# Patient Record
Sex: Female | Born: 2000 | Race: Black or African American | Hispanic: No | Marital: Single | Smoking: Never smoker
Health system: Southern US, Community
[De-identification: ages and names within clinical notes are randomized; demographics above are authoritative.]

## PROBLEM LIST (undated history)

## (undated) ENCOUNTER — Emergency Department (HOSPITAL_COMMUNITY): Admission: EM | Discharge status: Absent without leave | Payer: Self-pay

## (undated) DIAGNOSIS — L309 Dermatitis, unspecified: Secondary | ICD-10-CM

## (undated) HISTORY — PX: TONSILLECTOMY: SUR1361

---
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
## 2019-06-04 DIAGNOSIS — H40033 Anatomical narrow angle, bilateral: Secondary | ICD-10-CM | POA: Diagnosis not present

## 2019-06-04 DIAGNOSIS — H16223 Keratoconjunctivitis sicca, not specified as Sjogren's, bilateral: Secondary | ICD-10-CM | POA: Diagnosis not present

## 2019-06-06 ENCOUNTER — Ambulatory Visit: Payer: Medicaid Other | Attending: Family

## 2019-06-06 DIAGNOSIS — Z23 Encounter for immunization: Secondary | ICD-10-CM

## 2019-06-06 NOTE — Progress Notes (Signed)
   Covid-19 Vaccination Clinic  Name:  Wyvonne Carda    MRN: [id number] DOB: Jun 07, 2000  06/06/2019  Ms. James-Flack was observed post Covid-19 immunization for 15 minutes without incident. She was provided with Vaccine Information Sheet and instruction to access the V-Safe system.   Ms. Yiu was instructed to call 911 with any severe reactions post vaccine: Marland Kitchen Difficulty breathing  . Swelling of face and throat  . A fast heartbeat  . A bad rash all over body  . Dizziness and weakness   Immunizations Administered    Name Date Dose VIS Date Route   Moderna COVID-19 Vaccine 06/06/2019  3:06 PM 0.5 mL 01/22/2019 Intramuscular   Manufacturer: Moderna   Lot: [id number]   NDC: 78478-412-82

## 2019-07-05 DIAGNOSIS — H1013 Acute atopic conjunctivitis, bilateral: Secondary | ICD-10-CM | POA: Diagnosis not present

## 2019-07-09 ENCOUNTER — Ambulatory Visit: Payer: Medicaid Other | Attending: Family

## 2019-07-09 DIAGNOSIS — Z23 Encounter for immunization: Secondary | ICD-10-CM

## 2019-07-09 NOTE — Progress Notes (Signed)
   Covid-19 Vaccination Clinic  Name:  [name]    MRN: [id number] DOB: [birth year]  07/09/2019  Caroline Macias was observed post Covid-19 immunization for 15 minutes without incident. She was provided with Vaccine Information Sheet and instruction to access the V-Safe system.   [name] was instructed to call 911 with any severe reactions post vaccine: Marland Kitchen Difficulty breathing  . Swelling of face and throat  . A fast heartbeat  . A bad rash all over body  . Dizziness and weakness   Immunizations Administered    Name Date Dose VIS Date Route   Moderna COVID-19 Vaccine 07/09/2019  2:03 PM 0.5 mL 01/2019 Intramuscular   Manufacturer: Moderna   Lot: [id number]   NDC: 50413-643-83

## 2019-08-18 ENCOUNTER — Encounter (HOSPITAL_COMMUNITY): Payer: Self-pay | Admitting: *Deleted

## 2019-08-18 ENCOUNTER — Other Ambulatory Visit: Payer: Self-pay

## 2019-08-18 ENCOUNTER — Emergency Department (HOSPITAL_COMMUNITY)
Admission: EM | Admit: 2019-08-18 | Discharge: 2019-08-18 | Disposition: A | Discharge status: Home / Self Care | Payer: Medicaid Other | Attending: Emergency Medicine | Admitting: Emergency Medicine

## 2019-08-18 ENCOUNTER — Emergency Department (HOSPITAL_COMMUNITY): Payer: Medicaid Other

## 2019-08-18 DIAGNOSIS — Z23 Encounter for immunization: Secondary | ICD-10-CM | POA: Diagnosis not present

## 2019-08-18 DIAGNOSIS — S92354A Nondisplaced fracture of fifth metatarsal bone, right foot, initial encounter for closed fracture: Secondary | ICD-10-CM | POA: Insufficient documentation

## 2019-08-18 DIAGNOSIS — Y998 Other external cause status: Secondary | ICD-10-CM | POA: Insufficient documentation

## 2019-08-18 DIAGNOSIS — W3400XA Accidental discharge from unspecified firearms or gun, initial encounter: Secondary | ICD-10-CM | POA: Diagnosis not present

## 2019-08-18 DIAGNOSIS — Y9389 Activity, other specified: Secondary | ICD-10-CM | POA: Insufficient documentation

## 2019-08-18 DIAGNOSIS — S91301A Unspecified open wound, right foot, initial encounter: Secondary | ICD-10-CM | POA: Diagnosis not present

## 2019-08-18 DIAGNOSIS — S92354B Nondisplaced fracture of fifth metatarsal bone, right foot, initial encounter for open fracture: Secondary | ICD-10-CM | POA: Diagnosis not present

## 2019-08-18 DIAGNOSIS — Y92016 Swimming-pool in single-family (private) house or garden as the place of occurrence of the external cause: Secondary | ICD-10-CM | POA: Diagnosis not present

## 2019-08-18 DIAGNOSIS — S99921A Unspecified injury of right foot, initial encounter: Secondary | ICD-10-CM | POA: Diagnosis present

## 2019-08-18 DIAGNOSIS — S91341A Puncture wound with foreign body, right foot, initial encounter: Secondary | ICD-10-CM | POA: Diagnosis not present

## 2019-08-18 DIAGNOSIS — T07XXXA Unspecified multiple injuries, initial encounter: Secondary | ICD-10-CM | POA: Diagnosis not present

## 2019-08-18 DIAGNOSIS — M7989 Other specified soft tissue disorders: Secondary | ICD-10-CM | POA: Diagnosis not present

## 2019-08-18 DIAGNOSIS — R52 Pain, unspecified: Secondary | ICD-10-CM | POA: Diagnosis not present

## 2019-08-18 DIAGNOSIS — S91321A Laceration with foreign body, right foot, initial encounter: Secondary | ICD-10-CM | POA: Diagnosis not present

## 2019-08-18 MED ORDER — LIDOCAINE-EPINEPHRINE (PF) 2 %-1:200000 IJ SOLN
10.0000 mL | Freq: Once | INTRAMUSCULAR | Status: AC
  Administered 2019-08-18: 10 mL
  Filled 2019-08-18: qty 20

## 2019-08-18 MED ORDER — TETANUS-DIPHTH-ACELL PERTUSSIS 5-2.5-18.5 LF-MCG/0.5 IM SUSP
0.5000 mL | Freq: Once | INTRAMUSCULAR | Status: AC
  Administered 2019-08-18: 0.5 mL via INTRAMUSCULAR
  Filled 2019-08-18: qty 0.5

## 2019-08-18 MED ORDER — CEPHALEXIN 500 MG PO CAPS: 500.0000 mg | ORAL_CAPSULE | Freq: Four times a day (QID) | ORAL | 0 refills | Status: DC

## 2019-08-18 MED ORDER — FENTANYL CITRATE (PF) 100 MCG/2ML IJ SOLN
50.0000 ug | Freq: Once | INTRAMUSCULAR | Status: AC
  Administered 2019-08-18: 50 ug via INTRAVENOUS
  Filled 2019-08-18: qty 2

## 2019-08-18 MED ORDER — CEFAZOLIN SODIUM-DEXTROSE 1-4 GM/50ML-% IV SOLN
1.0000 g | Freq: Once | INTRAVENOUS | Status: AC
  Administered 2019-08-18: 1 g via INTRAVENOUS
  Filled 2019-08-18: qty 50
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
## 2019-08-18 NOTE — Consult Note (Signed)
Visited briefly with pt, offering spiritual support and prayer, which she appreciated. Pt is Caroline Macias.  Rev. Donnel Saxon Chaplain

## 2019-08-18 NOTE — Progress Notes (Signed)
Orthopedic Tech Progress Note Patient Details:  Caroline Macias 05-02-2000 [id number]  Ortho Devices Type of Ortho Device: CAM walker Ortho Device/Splint Location: rle Ortho Device/Splint Interventions: Ordered, Application, Adjustment   Post Interventions Patient Tolerated: Well Instructions Provided: Care of device, Adjustment of device   Trinna Post 08/18/2019, 10:06 PM

## 2019-08-18 NOTE — ED Notes (Signed)
Patient verbalizes understanding of discharge instructions. Opportunity for questioning and answers were provided. Armband removed by staff, pt discharged from ED. Pt. ambulatory and discharged home.  

## 2019-08-18 NOTE — Discharge Instructions (Addendum)
Call Dr. Luvenia Starch office this week for follow up. Call Dr. Jena Gauss or return for any signs or symptoms of infection such as redness, discharge, or increasing pain.
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
## 2019-09-07 DIAGNOSIS — H5213 Myopia, bilateral: Secondary | ICD-10-CM | POA: Diagnosis not present

## 2019-09-29 DIAGNOSIS — H5213 Myopia, bilateral: Secondary | ICD-10-CM | POA: Diagnosis not present

## 2019-12-10 ENCOUNTER — Encounter: Payer: Self-pay | Admitting: Nurse Practitioner

## 2019-12-10 ENCOUNTER — Other Ambulatory Visit: Payer: Self-pay

## 2019-12-10 ENCOUNTER — Ambulatory Visit: Payer: Medicaid Other | Attending: Nurse Practitioner | Admitting: Nurse Practitioner

## 2019-12-10 VITALS — Ht 64.0 in | Wt 180.0 lb

## 2019-12-10 DIAGNOSIS — L700 Acne vulgaris: Secondary | ICD-10-CM | POA: Diagnosis not present

## 2019-12-10 DIAGNOSIS — L209 Atopic dermatitis, unspecified: Secondary | ICD-10-CM

## 2019-12-10 MED ORDER — MOMETASONE FUROATE 0.1 % EX OINT: TOPICAL_OINTMENT | Freq: Every day | CUTANEOUS | 2 refills | Status: DC

## 2019-12-10 MED ORDER — ADAPALENE 0.3 % EX GEL: CUTANEOUS | 1 refills | Status: DC

## 2019-12-10 NOTE — Progress Notes (Signed)
Virtual Visit via Telephone Note Due to national recommendations of social distancing due to COVID 19, telehealth visit is felt to be most appropriate for this patient at this time.  I discussed the limitations, risks, security and privacy concerns of performing an evaluation and management service by telephone and the availability of in person appointments. I also discussed with the patient that there may be a patient responsible charge related to this service. The patient expressed understanding and agreed to proceed.    I connected with Caroline Macias on 12/10/19  at   8:50 AM EDT  EDT by telephone and verified that I am speaking with the correct person using two identifiers.   Consent I discussed the limitations, risks, security and privacy concerns of performing an evaluation and management service by telephone and the availability of in person appointments. I also discussed with the patient that there may be a patient responsible charge related to this service. The patient expressed understanding and agreed to proceed.   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  She is sexually active and currently not on any contraception at this time.  I discussed oral contraceptives and Depo Provera which we provide here at the community clinic however she is interested in the Nexplanon.  I will have her referred to Dr. Earlene Plater at the counseling office for further counseling.   Skin Problem Notes significant scarring on face due to acne. She has been out of differin gel. Will refill today. She does state that even when she was using differin gel consistently she was still  experiencing scarring of skin.  Also notes areas of hyperpigmentation between both thighs. Onset 2 months ago. Denies irritation or itching.   Past Medical History:  Diagnosis Date   Eczema     Past Surgical History:  Procedure Laterality Date   TONSILLECTOMY      Family History  Problem Relation Age of Onset   Diabetes Neg Hx     Social History   Socioeconomic History   Marital status: Single    Spouse name: Not on file   Number of children: Not on file   Years of education: Not on file   Highest education level: Not on file  Occupational History   Not on file  Tobacco Use   Smoking status: Never Smoker   Smokeless tobacco: Never Used  Substance and Sexual Activity   Alcohol use: Not Currently   Drug use: Not Currently   Sexual activity: Not Currently  Other Topics Concern   Not on file  Social History Narrative   Not on file   Social Determinants of Health   Financial Resource Strain:    Difficulty of Paying Living Expenses: Not on file  Food Insecurity:    Worried About Running Out of Food in the Last Year: Not on file   Ran Out of Food in the Last Year: Not on file  Transportation Needs:    Lack of Transportation (Medical): Not on file   Lack of Transportation (Non-Medical): Not on file  Physical Activity:    Days of Exercise per Week: Not on file   Minutes of Exercise per Session: Not on file  Stress:  Feeling of Stress : Not on file  Social Connections:    Frequency of Communication with Friends and Family: Not on file   Frequency of Social Gatherings with Friends and Family: Not on file   Attends Religious Services: Not on file   Active Member of Clubs or Organizations: Not on file   Attends Banker Meetings: Not on file   Marital Status: Not on file     Observations/Objective: Awake, alert and oriented x 3   Review of Systems  Constitutional: Negative for fever, malaise/fatigue and weight loss.   HENT: Negative.  Negative for nosebleeds.   Eyes: Negative.  Negative for blurred vision, double vision and photophobia.  Respiratory: Negative.  Negative for cough and shortness of breath.   Cardiovascular: Negative.  Negative for chest pain, palpitations and leg swelling.  Gastrointestinal: Negative.  Negative for heartburn, nausea and vomiting.  Musculoskeletal: Negative.  Negative for myalgias.  Skin:       SEE HPI  Neurological: Negative.  Negative for dizziness, focal weakness, seizures and headaches.  Psychiatric/Behavioral: Negative.  Negative for suicidal ideas.    Assessment and Plan: [name] was seen today for establish care.  Diagnoses and all orders for this visit:  Acne vulgaris -     Ambulatory referral to Dermatology -     Adapalene 0.3 % gel; Apply topically at bedtime  Atopic dermatitis, unspecified type -     mometasone (ELOCON) 0.1 % ointment; Apply topically daily.     Follow Up Instructions Return for FASTING labs and Physical.     I discussed the assessment and treatment plan with the patient. The patient was provided an opportunity to ask questions and all were answered. The patient agreed with the plan and demonstrated an understanding of the instructions.   The patient was advised to call back or seek an in-person evaluation if the symptoms worsen or if the condition fails to improve as anticipated.  I provided 18 minutes of non-face-to-face time during this encounter including median intraservice time, reviewing previous notes, labs, imaging, medications and explaining diagnosis and management.  Claiborne Rigg, FNP-BC

## 2019-12-12 ENCOUNTER — Telehealth: Payer: Medicaid Other | Admitting: Internal Medicine

## 2019-12-12 ENCOUNTER — Other Ambulatory Visit: Payer: Self-pay | Admitting: Nurse Practitioner

## 2019-12-12 ENCOUNTER — Other Ambulatory Visit: Payer: Medicaid Other

## 2019-12-12 DIAGNOSIS — Z3009 Encounter for other general counseling and advice on contraception: Secondary | ICD-10-CM

## 2019-12-12 MED ORDER — MOMETASONE FUROATE 0.1 % EX CREA: 1.0000 "application " | TOPICAL_CREAM | Freq: Every day | CUTANEOUS | 3 refills | Status: DC

## 2019-12-30 ENCOUNTER — Encounter: Payer: Medicaid Other | Admitting: Nurse Practitioner

## 2020-01-28 ENCOUNTER — Encounter: Payer: Self-pay | Admitting: Nurse Practitioner

## 2020-01-28 ENCOUNTER — Other Ambulatory Visit: Payer: Self-pay | Admitting: Nurse Practitioner

## 2020-01-28 ENCOUNTER — Other Ambulatory Visit: Payer: Self-pay

## 2020-01-28 ENCOUNTER — Ambulatory Visit: Payer: Medicaid Other | Attending: Nurse Practitioner | Admitting: Nurse Practitioner

## 2020-01-28 VITALS — BP 104/57 | HR 69 | Temp 95.7°F | Ht 65.0 in | Wt 162.0 lb

## 2020-01-28 DIAGNOSIS — L209 Atopic dermatitis, unspecified: Secondary | ICD-10-CM | POA: Diagnosis not present

## 2020-01-28 DIAGNOSIS — Z130 Encounter for screening for diseases of the blood and blood-forming organs and certain disorders involving the immune mechanism: Secondary | ICD-10-CM

## 2020-01-28 DIAGNOSIS — Z114 Encounter for screening for human immunodeficiency virus [HIV]: Secondary | ICD-10-CM | POA: Diagnosis not present

## 2020-01-28 DIAGNOSIS — Z1159 Encounter for screening for other viral diseases: Secondary | ICD-10-CM | POA: Diagnosis not present

## 2020-01-28 DIAGNOSIS — Z0000 Encounter for general adult medical examination without abnormal findings: Secondary | ICD-10-CM | POA: Diagnosis not present

## 2020-01-28 DIAGNOSIS — L700 Acne vulgaris: Secondary | ICD-10-CM | POA: Diagnosis not present

## 2020-01-28 MED ORDER — IBUPROFEN 600 MG PO TABS: 600.0000 mg | ORAL_TABLET | Freq: Three times a day (TID) | ORAL | 1 refills | Status: AC | PRN

## 2020-01-28 MED ORDER — MOMETASONE FUROATE 0.1 % EX CREA: 1.0000 "application " | TOPICAL_CREAM | Freq: Every day | CUTANEOUS | 3 refills | Status: AC

## 2020-01-28 MED ORDER — ADAPALENE 0.3 % EX GEL: CUTANEOUS | 1 refills | Status: DC

## 2020-01-28 NOTE — Progress Notes (Signed)
Assessment & Plan:  Caroline Macias was seen today for annual exam.  Diagnoses and all orders for this visit:  Encounter for annual physical exam  Need for hepatitis C screening test -     HCV Ab w Reflex to Quant PCR  Encounter for screening for HIV -     HIV antibody (with reflex)  Screening for deficiency anemia -     CBC  Acne vulgaris -     Ambulatory referral to Dermatology -     Adapalene 0.3 % gel; Apply topically at bedtime  Atopic dermatitis, unspecified type -     mometasone (ELOCON) 0.1 % cream; Apply 1 application topically daily.    Patient has been counseled on age-appropriate routine health concerns for screening and prevention. These are reviewed and up-to-date. Referrals have been placed accordingly. Immunizations are up-to-date or declined.    Subjective:   Chief Complaint  Patient presents with  . Annual Exam    Pt. is here for a physical.    HPI Taitum James-Flack 19 y.o. female presents to office today for annual physical.    She was referred to Dermatology a few months for acne vulgaris however she did not realize they had left a message to schedule. I have her their phone number to schedule and placed another referral. Her acne has been unresponsive to the adalapene gel. She also tells me she has been using it twice a day. I instructed her that it is only once a day.  Review of Systems  Constitutional: Negative for fever, malaise/fatigue and weight loss.  HENT: Negative.  Negative for nosebleeds.   Eyes: Negative.  Negative for blurred vision, double vision and photophobia.  Respiratory: Negative.  Negative for cough and shortness of breath.   Cardiovascular: Negative.  Negative for chest pain, palpitations and leg swelling.  Gastrointestinal: Negative.  Negative for heartburn, nausea and vomiting.  Genitourinary: Negative.   Musculoskeletal: Negative.  Negative for myalgias.  Skin: Negative.        Acne vulgaris Atopic dermatitis Vitiligo b/l  thighs  Neurological: Negative.  Negative for dizziness, focal weakness, seizures and headaches.  Endo/Heme/Allergies: Negative.   Psychiatric/Behavioral: Negative.  Negative for suicidal ideas.    Past Medical History:  Diagnosis Date  . Eczema     Past Surgical History:  Procedure Laterality Date  . TONSILLECTOMY      Family History  Problem Relation Age of Onset  . Diabetes Neg Hx     Social History Reviewed with no changes to be made today.   Outpatient Medications Prior to Visit  Medication Sig Dispense Refill  . Adapalene 0.3 % gel Apply topically at bedtime 90 g 1  . mometasone (ELOCON) 0.1 % cream Apply 1 application topically daily. 100 g 3   No facility-administered medications prior to visit.    No Known Allergies     Objective:    BP (!) 104/57 (BP Location: Left Arm, Patient Position: Sitting, Cuff Size: Normal)   Pulse 69   Temp (!) 95.7 F (35.4 C) (Temporal)   Ht 5\' 5"  (1.651 m)   Wt 162 lb (73.5 kg)   LMP 01/28/2020   SpO2 98%   BMI 26.96 kg/m  Wt Readings from Last 3 Encounters:  01/28/20 162 lb (73.5 kg)  12/10/19 180 lb (81.6 kg)  08/18/19 204 lb 5.9 oz (92.7 kg)    Physical Exam Constitutional:      Appearance: She is well-developed.  HENT:     Head: Normocephalic  and atraumatic.     Right Ear: Hearing, ear canal and external ear normal. No middle ear effusion. Tympanic membrane is scarred.     Left Ear: Hearing, tympanic membrane, ear canal and external ear normal.     Nose: Nose normal.     Mouth/Throat:     Pharynx: No oropharyngeal exudate.  Eyes:     General: No scleral icterus.       Right eye: No discharge.     Conjunctiva/sclera: Conjunctivae normal.     Pupils: Pupils are equal, round, and reactive to light.  Neck:     Thyroid: No thyroid mass or thyromegaly.     Trachea: No tracheal deviation.  Cardiovascular:     Rate and Rhythm: Normal rate and regular rhythm.     Heart sounds: Normal heart sounds. No murmur  heard.  No friction rub.  Pulmonary:     Effort: Pulmonary effort is normal. No accessory muscle usage or respiratory distress.     Breath sounds: Normal breath sounds. No decreased breath sounds, wheezing, rhonchi or rales.  Abdominal:     General: Bowel sounds are normal. There is no distension.     Palpations: Abdomen is soft. There is no mass.     Tenderness: There is no abdominal tenderness. There is no guarding or rebound.  Musculoskeletal:        General: No tenderness or deformity. Normal range of motion.     Cervical back: Normal range of motion and neck supple.  Lymphadenopathy:     Cervical: No cervical adenopathy.  Skin:    General: Skin is warm and dry.     Findings: No erythema.  Neurological:     Mental Status: She is alert and oriented to person, place, and time.     Cranial Nerves: Cranial nerves are intact. No cranial nerve deficit.     Sensory: Sensation is intact.     Motor: Motor function is intact.     Coordination: Coordination is intact. Coordination normal.     Gait: Gait is intact.     Deep Tendon Reflexes:     Reflex Scores:      Patellar reflexes are 1+ on the right side and 1+ on the left side. Psychiatric:        Attention and Perception: Attention and perception normal.        Mood and Affect: Mood and affect normal.        Speech: Speech normal.        Behavior: Behavior normal.        Thought Content: Thought content normal.        Cognition and Memory: Cognition and memory normal.        Judgment: Judgment normal.          Patient has been counseled extensively about nutrition and exercise as well as the importance of adherence with medications and regular follow-up. The patient was given clear instructions to go to ER or return to medical center if symptoms don't improve, worsen or new problems develop. The patient verbalized understanding.   Follow-up: Return if symptoms worsen or fail to improve.   Claiborne Rigg, FNP-BC [facility] [place], [place] [phone]   01/28/2020, 3:39 PM

## 2020-01-28 NOTE — Patient Instructions (Addendum)
Sent Referral to [facility] Dr James Szabo  Ph. # [phone] address [address] [place], [place] [address] ([place]). They will contact the patient to schedule in [place] or [place]    

## 2020-01-29 LAB — HCV INTERPRETATION

## 2020-01-29 LAB — HIV ANTIBODY (ROUTINE TESTING W REFLEX): HIV Screen 4th Generation wRfx: NONREACTIVE

## 2020-01-29 LAB — HCV AB W REFLEX TO QUANT PCR: HCV Ab: 0.2 s/co ratio (ref 0.0–0.9)

## 2020-02-06 ENCOUNTER — Encounter: Payer: Self-pay | Admitting: Nurse Practitioner

## 2020-02-07 ENCOUNTER — Other Ambulatory Visit: Payer: Self-pay | Admitting: Nurse Practitioner

## 2020-04-17 DIAGNOSIS — L209 Atopic dermatitis, unspecified: Secondary | ICD-10-CM | POA: Diagnosis not present

## 2020-04-17 DIAGNOSIS — L815 Leukoderma, not elsewhere classified: Secondary | ICD-10-CM | POA: Diagnosis not present

## 2020-04-17 DIAGNOSIS — L700 Acne vulgaris: Secondary | ICD-10-CM | POA: Diagnosis not present

## 2020-05-16 ENCOUNTER — Telehealth: Payer: Medicaid Other | Admitting: Nurse Practitioner

## 2020-05-16 ENCOUNTER — Encounter: Payer: Self-pay | Admitting: Nurse Practitioner

## 2020-05-16 DIAGNOSIS — H60331 Swimmer's ear, right ear: Secondary | ICD-10-CM | POA: Diagnosis not present

## 2020-05-16 MED ORDER — OFLOXACIN 0.3 % OT SOLN: 5.0000 [drp] | Freq: Every day | OTIC | 0 refills | Status: AC

## 2020-05-16 NOTE — Progress Notes (Signed)
Ms. darcey, cardy are scheduled for a virtual visit with your provider today.    Just as we do with appointments in the office, we must obtain your consent to participate.  Your consent will be active for this visit and any virtual visit you may have with one of our providers in the next 365 days.    If you have a MyChart account, I can also send a copy of this consent to you electronically.  All virtual visits are billed to your insurance company just like a traditional visit in the office.  As this is a virtual visit, video technology does not allow for your provider to perform a traditional examination.  This may limit your provider's ability to fully assess your condition.  If your provider identifies any concerns that need to be evaluated in person or the need to arrange testing such as labs, EKG, etc, we will make arrangements to do so.    Although advances in technology are sophisticated, we cannot ensure that it will always work on either your end or our end.  If the connection with a video visit is poor, we may have to switch to a telephone visit.  With either a video or telephone visit, we are not always able to ensure that we have a secure connection.   I need to obtain your verbal consent now.   Are you willing to proceed with your visit today?   Hila James-Flack has provided verbal consent on 05/16/2020 for a virtual visit (video or telephone).   Mary-Margaret Daphine Deutscher, FNP 05/16/2020  5:30 PM   Virtual Visit via video Note   Due to COVID-19 pandemic this visit was conducted virtually. This visit type was conducted due to national recommendations for restrictions regarding the COVID-19 Pandemic (e.g. social distancing, sheltering in place) in an effort to limit this patient's exposure and mitigate transmission in our community. All issues noted in this document were discussed and addressed.  A physical exam was not performed with this format.  I connected with  Shristi James-Flack  on  05/16/20 at 5:20 by video and verified that I am speaking with the correct person using two identifiers. Gala James-Flack is currently located at home and no one is currently with  her during visit. The provider, Mary-Margaret Daphine Deutscher, FNP is located in their office at time of visit.  I discussed the limitations, risks, security and privacy concerns of performing an evaluation and management service by video  and the availability of in person appointments. I also discussed with the patient that there may be a patient responsible charge related to this service. The patient expressed understanding and agreed to proceed.   History and Present Illness:   Chief Complaint: Otalgia   HPI Patient states she is having right ear pain. Started yesterday with drainage and slight pian. Today has been draining yellowish fluid. Has tenderness when she touches her ear.   Review of Systems  Constitutional: Negative for chills and fever.  HENT: Positive for ear discharge and ear pain. Negative for congestion, sinus pain and sore throat.   Respiratory: Positive for cough.   Neurological: Negative for dizziness and headaches.  All other systems reviewed and are negative.      Observations/Objective: Alert and oriented- answers all questions appropriately No distress Pain on moving of auricle of right ear Tragus tenderness on right   Assessment and Plan: Taquanna James-Flack in today with chief complaint of Otalgia   1. Acute swimmer's ear of right side Avoid  getting water in ear canal Motrin or tylenol OTC for pain Meds ordered this encounter  Medications  . ofloxacin (FLOXIN OTIC) 0.3 % OTIC solution    Sig: Place 5 drops into the right ear daily.    Dispense:  5 mL    Refill:  0    Order Specific Question:   Supervising Provider    Answer:   Eber Hong [[id number]]       Follow Up Instructions: prn    I discussed the assessment and treatment plan with the patient. The patient was  provided an opportunity to ask questions and all were answered. The patient agreed with the plan and demonstrated an understanding of the instructions.   The patient was advised to call back or seek an in-person evaluation if the symptoms worsen or if the condition fails to improve as anticipated.  The above assessment and management plan was discussed with the patient. The patient verbalized understanding of and has agreed to the management plan. Patient is aware to call the clinic if symptoms persist or worsen. Patient is aware when to return to the clinic for a follow-up visit. Patient educated on when it is appropriate to go to the emergency department.   Time call ended: 5:34  I provided 14 minutes of face-to-face time during this encounter.    Mary-Margaret Daphine Deutscher, FNP

## 2020-06-30 DIAGNOSIS — Z113 Encounter for screening for infections with a predominantly sexual mode of transmission: Secondary | ICD-10-CM | POA: Diagnosis not present

## 2020-07-17 DIAGNOSIS — L700 Acne vulgaris: Secondary | ICD-10-CM | POA: Diagnosis not present

## 2020-07-17 DIAGNOSIS — L209 Atopic dermatitis, unspecified: Secondary | ICD-10-CM | POA: Diagnosis not present

## 2020-08-17 DIAGNOSIS — Z30017 Encounter for initial prescription of implantable subdermal contraceptive: Secondary | ICD-10-CM | POA: Diagnosis not present
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
## 2020-10-02 DIAGNOSIS — L700 Acne vulgaris: Secondary | ICD-10-CM | POA: Diagnosis not present

## 2020-10-02 DIAGNOSIS — L209 Atopic dermatitis, unspecified: Secondary | ICD-10-CM | POA: Diagnosis not present

## 2021-01-08 DIAGNOSIS — L810 Postinflammatory hyperpigmentation: Secondary | ICD-10-CM | POA: Diagnosis not present

## 2021-01-08 DIAGNOSIS — L209 Atopic dermatitis, unspecified: Secondary | ICD-10-CM | POA: Diagnosis not present

## 2021-01-08 DIAGNOSIS — L700 Acne vulgaris: Secondary | ICD-10-CM | POA: Diagnosis not present

## 2021-01-18 DIAGNOSIS — Z113 Encounter for screening for infections with a predominantly sexual mode of transmission: Secondary | ICD-10-CM | POA: Diagnosis not present

## 2021-01-18 DIAGNOSIS — R21 Rash and other nonspecific skin eruption: Secondary | ICD-10-CM | POA: Diagnosis not present

## 2021-02-26 DIAGNOSIS — Z23 Encounter for immunization: Secondary | ICD-10-CM | POA: Diagnosis not present

## 2021-03-12 DIAGNOSIS — B009 Herpesviral infection, unspecified: Secondary | ICD-10-CM | POA: Diagnosis not present

## 2021-03-16 ENCOUNTER — Ambulatory Visit: Payer: Medicaid Other | Attending: Nurse Practitioner | Admitting: Nurse Practitioner

## 2021-03-16 ENCOUNTER — Encounter: Payer: Self-pay | Admitting: Nurse Practitioner

## 2021-03-16 ENCOUNTER — Other Ambulatory Visit: Payer: Self-pay

## 2021-03-16 DIAGNOSIS — B009 Herpesviral infection, unspecified: Secondary | ICD-10-CM | POA: Diagnosis not present

## 2021-03-16 MED ORDER — VALACYCLOVIR HCL 1 G PO TABS: 2000.0000 mg | ORAL_TABLET | Freq: Two times a day (BID) | ORAL | 3 refills | Status: AC

## 2021-03-16 NOTE — Progress Notes (Signed)
Virtual Visit via Telephone Note Due to national recommendations of social distancing due to COVID 19, telehealth visit is felt to be most appropriate for this patient at this time.  I discussed the limitations, risks, security and privacy concerns of performing an evaluation and management service by telephone and the availability of in person appointments. I also discussed with the patient that there may be a patient responsible charge related to this service. The patient expressed understanding and agreed to proceed.    I connected with Caroline Macias on 03/16/21  at   8:50 AM EST  EDT by telephone and verified that I am speaking with the correct person using two identifiers.  Location of Patient: Private Residence   Location of Provider: [facility]    Persons participating in Telemedicine visit: Caroline Macias Caroline Macias    History of Present Illness: Telemedicine visit for: HSV1 outbreak  Notes outbreak of cold sore. 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     Past Medical History:  Diagnosis Date   Eczema     Past Surgical History:  Procedure Laterality Date   TONSILLECTOMY      Family History  Problem Relation Age of Onset   Diabetes Neg Hx     Social History   Socioeconomic History   Marital status: Single    Spouse name: Not on file   Number of children: Not on file   Years of education: Not on file   Highest education level: Not on file  Occupational History   Not on file  Tobacco Use   Smoking status: Never   Smokeless tobacco: Never  Substance and Sexual Activity   Alcohol use: Not Currently   Drug use: Not Currently   Sexual activity: Not Currently  Other Topics Concern   Not on file  Social History Narrative   Not on file   Social Determinants of Health   Financial Resource Strain: Not on file   Food Insecurity: Not on file  Transportation Needs: Not on file  Physical Activity: Not on file  Stress: Not on file  Social Connections: Not on file     Observations/Objective: Awake, alert and oriented x 3   Review of Systems  Constitutional:  Negative for fever, malaise/fatigue and weight loss.  HENT: Negative.  Negative for nosebleeds.   Eyes: Negative.  Negative for blurred vision, double vision and photophobia.  Respiratory: Negative.  Negative for cough and shortness of breath.   Cardiovascular: Negative.  Negative for chest pain, palpitations and leg swelling.  Gastrointestinal: Negative.  Negative for heartburn, nausea and vomiting.  Musculoskeletal: Negative.  Negative for myalgias.  Skin:        HSV1 outbreak  Neurological: Negative.  Negative for dizziness, focal weakness, seizures and headaches.  Psychiatric/Behavioral: Negative.  Negative for suicidal ideas.    Assessment and Plan: Diagnoses and all orders for this visit:  HSV-1 infection -     valACYclovir (VALTREX) 1000 MG tablet; Take 2 tablets (2,000 mg total) by mouth 2 (two) times daily for 1 day.     Follow Up Instructions Return if symptoms worsen or fail to improve.     I discussed the assessment and treatment plan with the patient. The patient was provided an opportunity to ask questions and all were answered. The patient agreed with the plan and demonstrated an understanding of the instructions.  The patient was advised to call back or seek an in-person evaluation if the symptoms worsen or if the condition fails to improve as anticipated.  I provided 11 minutes of non-face-to-face time during this encounter including median intraservice time, reviewing previous notes, labs, imaging, medications and explaining diagnosis and management.  Claiborne Rigg, Macias

## 2021-04-09 DIAGNOSIS — L209 Atopic dermatitis, unspecified: Secondary | ICD-10-CM | POA: Diagnosis not present

## 2021-04-09 DIAGNOSIS — L700 Acne vulgaris: Secondary | ICD-10-CM | POA: Diagnosis not present

## 2021-04-09 DIAGNOSIS — L810 Postinflammatory hyperpigmentation: Secondary | ICD-10-CM | POA: Diagnosis not present

## 2021-06-23 DIAGNOSIS — L700 Acne vulgaris: Secondary | ICD-10-CM | POA: Diagnosis not present

## 2021-06-23 DIAGNOSIS — L209 Atopic dermatitis, unspecified: Secondary | ICD-10-CM | POA: Diagnosis not present

## 2021-09-24 DIAGNOSIS — L209 Atopic dermatitis, unspecified: Secondary | ICD-10-CM | POA: Diagnosis not present

## 2021-09-24 DIAGNOSIS — L700 Acne vulgaris: Secondary | ICD-10-CM | POA: Diagnosis not present

## 2021-09-29 DIAGNOSIS — H5213 Myopia, bilateral: Secondary | ICD-10-CM | POA: Diagnosis not present

## 2021-10-18 ENCOUNTER — Telehealth: Payer: Self-pay | Admitting: Nurse Practitioner

## 2021-10-18 ENCOUNTER — Ambulatory Visit: Payer: Self-pay

## 2021-10-18 NOTE — Telephone Encounter (Signed)
Copied from CRM [id number]. Topic: General - Other >> Oct 18, 2021 11:12 AM Everette C wrote: Reason for CRM: Medication Refill - Medication: valACYclovir (VALTREX) 1000 MG tablet [[id number]]   Has the patient contacted their pharmacy? Yes.  The patient has been directed to contact their PCP  (Agent: If no, request that the patient contact the pharmacy for the refill. If patient does not wish to contact the pharmacy document the reason why and proceed with request.) (Agent: If yes, when and what did the pharmacy advise?)  Preferred Pharmacy (with phone number or street name): [id number] [place],  - [address] [place] [address] Phone: [phone] Fax: [phone] Hours: Not open 24 hours  Has the patient been seen for an appointment in the last year OR does the patient have an upcoming appointment? Yes.    Agent: Please be advised that RX refills may take up to 3 business days. We ask that you follow-up with your pharmacy.

## 2021-10-18 NOTE — Telephone Encounter (Signed)
    Patient called, left VM to return the call to the office to speak to a nurse. Will need triaging for the need for Valtrex.    Copied from CRM [id number]. Topic: General - Other >> Oct 18, 2021 11:12 AM Everette C wrote: Reason for CRM: Medication Refill - Medication: valACYclovir (VALTREX) 1000 MG tablet [[id number]]    Has the patient contacted their pharmacy? Yes.  The patient has been directed to contact their PCP  (Agent: If no, request that the patient contact the pharmacy for the refill. If patient does not wish to contact the pharmacy document the reason why and proceed with request.) (Agent: If yes, when and what did the pharmacy advise?)   Preferred Pharmacy (with phone number or street name): [id number] [place], [place] - [address] [place] [address] Phone: [phone] Fax: [phone]

## 2021-10-18 NOTE — Telephone Encounter (Signed)
Pt returned our call.   Chief Complaint: Missing medication Symptoms: None at present Frequency: Ongoing Pertinent Negatives: Patient denies Current s/s  Disposition: [] ED /[] Urgent Care (no appt availability in office) / [] Appointment(In office/virtual)/ []  [facility]/ [] Home Care/ [] Refused Recommended Disposition /[] [facility]/ [x]  Follow-up with PCP Additional Notes: Pt would like a supply of valacyclovir to have on hand for outbreaks.    Copied from CRM [id number]. Topic: General - Other >> Oct 18, 2021 11:12 AM Everette C wrote: Reason for CRM: Medication Refill - Medication: valACYclovir (VALTREX) 1000 MG tablet    Has the patient contacted their pharmacy? Yes.  The patient has been directed to contact their PCP  (Agent: If no, request that the patient contact the pharmacy for the refill. If patient does not wish to contact the pharmacy document the reason why and proceed with request.) (Agent: If yes, when and what did the pharmacy advise?)   Preferred Pharmacy (with phone number or street name): [id number] , [place] - [id number] Phone: [phone] Fax: [phone] Hours: Not open 24 hours   Has the patient been seen for an appointment in the last year OR does the patient have an upcoming appointment? Yes.     Agent: Please be advised that RX refills may take up to 3 business days. We ask that you follow-up with your pharmacy.   Reason for Disposition  [1] Prescription refill request for ESSENTIAL medicine (i.e., likelihood of harm to patient if not taken) AND [2] triager unable to refill per department policy  Answer Assessment - Initial Assessment Questions 1. DRUG NAME: "What medicine do you need to have refilled?"     Valcyclovir 2. REFILLS REMAINING: "How many refills are remaining?" (Note: The label on the medicine or pill bottle will show how many refills are remaining.  If there are no refills remaining, then a renewal may be needed.)     none 3. EXPIRATION DATE: "What is the expiration date?" (Note: The label states when the prescription will expire, and thus can no longer be refilled.)     na 4. PRESCRIBING HCP: "Who prescribed it?" Reason: If prescribed by specialist, call should be referred to that group.      5. SYMPTOMS: "Do you have any symptoms?"     None at present 6. PREGNANCY: "Is there any chance that you are pregnant?" "When was your last menstrual period?"  Protocols used: Medication Refill and Renewal Call-A-AH

## 2021-10-18 NOTE — Telephone Encounter (Signed)
Patient called, left VM to return the call to speak to a nurse. She will need to be triaged, see nurse triage encounter.

## 2021-10-18 NOTE — Telephone Encounter (Signed)
This encounter was created in error - please disregard.

## 2021-10-18 NOTE — Progress Notes (Signed)
Error

## 2021-10-20 ENCOUNTER — Other Ambulatory Visit: Payer: Self-pay | Admitting: Nurse Practitioner

## 2021-10-20 DIAGNOSIS — B009 Herpesviral infection, unspecified: Secondary | ICD-10-CM

## 2021-10-20 MED ORDER — VALACYCLOVIR HCL 1 G PO TABS: 2000.0000 mg | ORAL_TABLET | Freq: Two times a day (BID) | ORAL | 3 refills | Status: AC

## 2022-02-08 ENCOUNTER — Telehealth: Payer: Self-pay | Admitting: Nurse Practitioner

## 2022-02-08 DIAGNOSIS — B009 Herpesviral infection, unspecified: Secondary | ICD-10-CM

## 2022-02-11 NOTE — Telephone Encounter (Signed)
Patient request refill for Valtrex, not on current list. Routing for review.

## 2022-02-11 NOTE — Telephone Encounter (Signed)
Pt scheduled next available appt Tuesday 02/15/2022, seeking refill today

## 2022-02-15 ENCOUNTER — Encounter: Payer: Self-pay | Admitting: Nurse Practitioner

## 2022-02-15 ENCOUNTER — Ambulatory Visit: Payer: Medicaid Other | Attending: Nurse Practitioner | Admitting: Nurse Practitioner

## 2022-02-15 VITALS — BP 123/80 | HR 54 | Ht 65.0 in | Wt 180.0 lb

## 2022-02-15 DIAGNOSIS — Z23 Encounter for immunization: Secondary | ICD-10-CM

## 2022-02-15 DIAGNOSIS — B009 Herpesviral infection, unspecified: Secondary | ICD-10-CM | POA: Diagnosis not present

## 2022-02-15 MED ORDER — VALACYCLOVIR HCL 1 G PO TABS: 1000.0000 mg | ORAL_TABLET | Freq: Every day | ORAL | 3 refills | Status: DC

## 2022-02-15 NOTE — Progress Notes (Signed)
Assessment & Plan:  [name] was seen today for medication refill.  Diagnoses and all orders for this visit:  HSV-1 infection -     HSV 2 antibody, IgG -     valACYclovir (VALTREX) 1000 MG tablet; Take 1 tablet (1,000 mg total) by mouth daily.  Need for HPV vaccination -     HPV 9-valent vaccine,Recombinat    Patient has been counseled on age-appropriate routine health concerns for screening and prevention. These are reviewed and up-to-date. Referrals have been placed accordingly. Immunizations are up-to-date or declined.    Subjective:   Chief Complaint  Patient presents with   Medication Refill   Medication Refill Pertinent negatives include no chest pain, coughing, fever, headaches, myalgias, nausea or vomiting.   Caroline Macias 21 y.o. female presents to office today for medication refill of valtrex for HSV 1 flare. She is currently not experiencing a flare at this time but needs refills on file.   She has experienced 4 active flares of HSV 1 since August. At this time we will start suppressive therapy. She has lots of questions today regarding transmission, suppression, and future sexual encounters.   Review of Systems  Constitutional:  Negative for fever, malaise/fatigue and weight loss.  HENT: Negative.  Negative for nosebleeds.   Eyes: Negative.  Negative for blurred vision, double vision and photophobia.  Respiratory: Negative.  Negative for cough and shortness of breath.   Cardiovascular: Negative.  Negative for chest pain, palpitations and leg swelling.  Gastrointestinal: Negative.  Negative for heartburn, nausea and vomiting.  Musculoskeletal: Negative.  Negative for myalgias.  Neurological: Negative.  Negative for dizziness, focal weakness, seizures and headaches.  Psychiatric/Behavioral: Negative.  Negative for suicidal ideas.     Past Medical History:  Diagnosis Date   Eczema     Past Surgical History:  Procedure Laterality Date   TONSILLECTOMY       Family History  Problem Relation Age of Onset   Diabetes Neg Hx     Social History Reviewed with no changes to be made today.   Outpatient Medications Prior to Visit  Medication Sig Dispense Refill   Clindamycin-Benzoyl Per, Refr, gel Apply 1 Application topically daily.     ibuprofen (ADVIL) 600 MG tablet Take 1 tablet (600 mg total) by mouth every 8 (eight) hours as needed for cramping. 60 tablet 1   mometasone (ELOCON) 0.1 % cream Apply 1 application topically daily. 100 g 3   ofloxacin (FLOXIN OTIC) 0.3 % OTIC solution Place 5 drops into the right ear daily. 5 mL 0   Adapalene 0.3 % gel Apply topically at bedtime (Patient not taking: Reported on 02/15/2022) 90 g 1   valACYclovir (VALTREX) 1000 MG tablet Take 2,000 mg by mouth 2 (two) times daily. (Patient not taking: Reported on 02/15/2022)     No facility-administered medications prior to visit.    No Known Allergies     Objective:    BP 123/80   Pulse (!) 54   Ht 5\' 5"  (1.651 m)   Wt 180 lb (81.6 kg)   LMP 01/17/2022 (Approximate)   SpO2 100%   BMI 29.95 kg/m  Wt Readings from Last 3 Encounters:  02/15/22 180 lb (81.6 kg)  01/28/20 162 lb (73.5 kg)  12/10/19 180 lb (81.6 kg)    Physical Exam Vitals and nursing note reviewed.  Constitutional:      Appearance: She is well-developed.  HENT:     Head: Normocephalic and atraumatic.  Cardiovascular:  Rate and Rhythm: Normal rate and regular rhythm.     Heart sounds: Normal heart sounds. No murmur heard.    No friction rub. No gallop.  Pulmonary:     Effort: Pulmonary effort is normal. No tachypnea or respiratory distress.     Breath sounds: Normal breath sounds. No decreased breath sounds, wheezing, rhonchi or rales.  Chest:     Chest wall: No tenderness.  Abdominal:     General: Bowel sounds are normal.     Palpations: Abdomen is soft.  Musculoskeletal:        General: Normal range of motion.     Cervical back: Normal range of motion.  Skin:     General: Skin is warm and dry.  Neurological:     Mental Status: She is alert and oriented to person, place, and time.     Coordination: Coordination normal.  Psychiatric:        Behavior: Behavior normal. Behavior is cooperative.        Thought Content: Thought content normal.        Judgment: Judgment normal.          Patient has been counseled extensively about nutrition and exercise as well as the importance of adherence with medications and regular follow-up. The patient was given clear instructions to go to ER or return to medical center if symptoms don't improve, worsen or new problems develop. The patient verbalized understanding.   Follow-up: Return if symptoms worsen or fail to improve.   Claiborne Rigg, FNP-BC [facility] [place], [place] [phone]   02/15/2022, 5:17 PM

## 2022-02-16 LAB — HSV-2 AB, IGG: HSV 2 IgG, Type Spec: <0.91 index (ref 0.00–0.90)

## 2022-03-18 DIAGNOSIS — F4310 Post-traumatic stress disorder, unspecified: Secondary | ICD-10-CM | POA: Diagnosis not present

## 2022-03-25 DIAGNOSIS — L700 Acne vulgaris: Secondary | ICD-10-CM | POA: Diagnosis not present

## 2022-03-25 DIAGNOSIS — L819 Disorder of pigmentation, unspecified: Secondary | ICD-10-CM | POA: Diagnosis not present

## 2022-03-25 DIAGNOSIS — L209 Atopic dermatitis, unspecified: Secondary | ICD-10-CM | POA: Diagnosis not present

## 2022-03-25 DIAGNOSIS — F4310 Post-traumatic stress disorder, unspecified: Secondary | ICD-10-CM | POA: Diagnosis not present

## 2022-04-01 DIAGNOSIS — F4310 Post-traumatic stress disorder, unspecified: Secondary | ICD-10-CM | POA: Diagnosis not present

## 2022-04-08 DIAGNOSIS — F4310 Post-traumatic stress disorder, unspecified: Secondary | ICD-10-CM | POA: Diagnosis not present

## 2022-04-15 DIAGNOSIS — F4310 Post-traumatic stress disorder, unspecified: Secondary | ICD-10-CM | POA: Diagnosis not present

## 2022-04-22 DIAGNOSIS — F4310 Post-traumatic stress disorder, unspecified: Secondary | ICD-10-CM | POA: Diagnosis not present

## 2022-04-29 DIAGNOSIS — F4310 Post-traumatic stress disorder, unspecified: Secondary | ICD-10-CM | POA: Diagnosis not present

## 2022-05-13 DIAGNOSIS — F4310 Post-traumatic stress disorder, unspecified: Secondary | ICD-10-CM | POA: Diagnosis not present

## 2022-05-17 DIAGNOSIS — F4310 Post-traumatic stress disorder, unspecified: Secondary | ICD-10-CM | POA: Diagnosis not present

## 2022-05-27 DIAGNOSIS — F4310 Post-traumatic stress disorder, unspecified: Secondary | ICD-10-CM | POA: Diagnosis not present

## 2022-06-03 DIAGNOSIS — F4310 Post-traumatic stress disorder, unspecified: Secondary | ICD-10-CM | POA: Diagnosis not present

## 2022-06-10 DIAGNOSIS — F4310 Post-traumatic stress disorder, unspecified: Secondary | ICD-10-CM | POA: Diagnosis not present

## 2022-06-17 DIAGNOSIS — F4310 Post-traumatic stress disorder, unspecified: Secondary | ICD-10-CM | POA: Diagnosis not present

## 2022-07-12 DIAGNOSIS — F4310 Post-traumatic stress disorder, unspecified: Secondary | ICD-10-CM | POA: Diagnosis not present

## 2022-08-02 DIAGNOSIS — F4310 Post-traumatic stress disorder, unspecified: Secondary | ICD-10-CM | POA: Diagnosis not present

## 2022-08-05 DIAGNOSIS — L700 Acne vulgaris: Secondary | ICD-10-CM | POA: Diagnosis not present

## 2022-08-05 DIAGNOSIS — L209 Atopic dermatitis, unspecified: Secondary | ICD-10-CM | POA: Diagnosis not present

## 2022-10-07 DIAGNOSIS — Z202 Contact with and (suspected) exposure to infections with a predominantly sexual mode of transmission: Secondary | ICD-10-CM | POA: Diagnosis not present

## 2022-11-04 DIAGNOSIS — L700 Acne vulgaris: Secondary | ICD-10-CM | POA: Diagnosis not present

## 2022-11-04 DIAGNOSIS — L209 Atopic dermatitis, unspecified: Secondary | ICD-10-CM | POA: Diagnosis not present

## 2023-01-27 DIAGNOSIS — L209 Atopic dermatitis, unspecified: Secondary | ICD-10-CM | POA: Diagnosis not present

## 2023-01-27 DIAGNOSIS — L700 Acne vulgaris: Secondary | ICD-10-CM | POA: Diagnosis not present

## 2023-03-28 ENCOUNTER — Encounter (INDEPENDENT_AMBULATORY_CARE_PROVIDER_SITE_OTHER): Payer: Self-pay | Admitting: Primary Care

## 2023-03-28 ENCOUNTER — Ambulatory Visit (INDEPENDENT_AMBULATORY_CARE_PROVIDER_SITE_OTHER): Payer: Medicaid Other | Admitting: Primary Care

## 2023-03-28 VITALS — BP 125/81 | HR 72 | Resp 16 | Ht 64.0 in | Wt 159.6 lb

## 2023-03-28 DIAGNOSIS — B009 Herpesviral infection, unspecified: Secondary | ICD-10-CM

## 2023-03-28 DIAGNOSIS — L709 Acne, unspecified: Secondary | ICD-10-CM

## 2023-03-28 MED ORDER — VALACYCLOVIR HCL 1 G PO TABS: 1000.0000 mg | ORAL_TABLET | Freq: Every day | ORAL | 1 refills | Status: DC

## 2023-03-28 NOTE — Progress Notes (Signed)
 [facility]  Caroline Macias, is a 23 y.o. female  RDW:[id number]  FMW:[id number]  DOB - [birth year]  Chief Complaint  Patient presents with   Medication Refill    Pt has moved to [facility] and will be transferring care there Requesting med refill till she is able to find another pcp in the new area        Subjective:   Caroline Macias is a 23 y.o. female here today for an acute visit.  HPI  No problems updated.  Comprehensive ROS Pertinent positive and negative noted in HPI   No Known Allergies  Past Medical History:  Diagnosis Date   Eczema     Current Outpatient Medications on File Prior to Visit  Medication Sig Dispense Refill   Clindamycin-Benzoyl Per, Refr, gel Apply 1 Application topically daily.     ibuprofen  (ADVIL ) 600 MG tablet Take 1 tablet (600 mg total) by mouth every 8 (eight) hours as needed for cramping. 60 tablet 1   mometasone  (ELOCON ) 0.1 % cream Apply 1 application topically daily. 100 g 3   ofloxacin  (FLOXIN  OTIC) 0.3 % OTIC solution Place 5 drops into the right ear daily. 5 mL 0   No current facility-administered medications on file prior to visit.   Health Maintenance  Topic Date Due   Pap Smear  Never done   HPV Vaccine (2 - 3-dose series) 03/15/2022   Flu Shot  09/22/2022   COVID-19 Vaccine (3 - 2024-25 season) 10/23/2022   DTaP/Tdap/Td vaccine (2 - Td or Tdap) 08/17/2029   Hepatitis C Screening  Completed   HIV Screening  Completed    Objective:   Physical Exam Vitals reviewed.  HENT:     Head: Normocephalic.     Right Ear: Tympanic membrane and external ear normal.     Left Ear: Tympanic membrane and external ear normal.     Nose: Nose normal.  Cardiovascular:     Rate and Rhythm: Normal rate and regular rhythm.  Pulmonary:     Effort: Pulmonary effort is normal.     Breath sounds: Normal breath sounds.  Abdominal:     General: Abdomen is flat. There is distension.     Palpations: Abdomen is soft.   Musculoskeletal:        General: Normal range of motion.     Cervical back: Normal range of motion.  Skin:    Comments: acne  Neurological:     Mental Status: She is alert and oriented to person, place, and time.  Psychiatric:        Mood and Affect: Mood normal.        Behavior: Behavior normal.      Assessment & Plan  Caroline Macias was seen today for medication refill.  Diagnoses and all orders for this visit:  Acne, unspecified acne type Use OTC for acne until moved and new PCP can refer to derm  HSV-1 infection -     Discontinue: valACYclovir  (VALTREX ) 1000 MG tablet; Take 1 tablet (1,000 mg total) by mouth daily. -     Discontinue: valACYclovir  (VALTREX ) 1000 MG tablet; Take 1 tablet (1,000 mg total) by mouth daily.      Patient have been counseled extensively about nutrition and exercise. Other issues discussed during this visit include: low cholesterol diet, weight control and daily exercise, foot care, annual eye examinations at Ophthalmology, importance of adherence with medications and regular follow-up. We also discussed long term complications of uncontrolled diabetes and hypertension.   Moving  The  patient was given clear instructions to go to ER or return to medical center if symptoms don't improve, worsen or new problems develop. The patient verbalized understanding. The patient was told to call to get lab results if they haven't heard anything in the next week.   This note has been created with Education officer, environmental. Any transcriptional errors are unintentional.   Caroline SHAUNNA Bohr, NP 04/10/2023, 2:30 PM

## 2023-03-28 NOTE — Patient Instructions (Addendum)
Influenza Vaccine Injection What is this medication? INFLUENZA VACCINE (in floo EN zuh vak SEEN) reduces the risk of the influenza (flu). It does not treat influenza. It is still possible to get influenza after receiving this vaccine, but the symptoms may be less severe or not last as long. It works by helping your immune system learn how to fight off a future infection. This medicine may be used for other purposes; ask your health care provider or pharmacist if you have questions. COMMON BRAND NAME(S): Afluria Trivalent, FLUAD Trivalent, Fluarix Trivalent, Flublok Trivalent, FLUCELVAX Trivalent, Flulaval Trivalent, Fluzone Trivalent What should I tell my care team before I take this medication? They need to know if you have any of these conditions: Bleeding disorder like hemophilia Fever or infection Guillain-Barre syndrome or other neurological problems Immune system problems Infection with the human immunodeficiency virus (HIV) or AIDS Low blood platelet counts Multiple sclerosis An unusual or allergic reaction to influenza virus vaccine, latex, other medications, foods, dyes, or preservatives. Different brands of vaccines contain different allergens. Some may contain latex or eggs. Talk to your care team about your allergies to make sure that you get the right vaccine. Pregnant or trying to get pregnant Breastfeeding How should I use this medication? This vaccine is injected into a muscle or under the skin. It is given by your care team. A copy of Vaccine Information Statements will be given before each vaccination. Be sure to read this sheet carefully each time. This sheet may change often. Talk to your care team to see which vaccines are right for you. Some vaccines should not be used in all age groups. Overdosage: If you think you have taken too much of this medicine contact a poison control center or emergency room at once. NOTE: This medicine is only for you. Do not share this medicine  with others. What if I miss a dose? This does not apply. What may interact with this medication? Certain medications that lower your immune system, such as etanercept, anakinra, infliximab, adalimumab Certain medications that prevent or treat blood clots, such as warfarin Chemotherapy or radiation therapy Phenytoin Steroid medications, such as prednisone or cortisone Theophylline Vaccines This list may not describe all possible interactions. Give your health care provider a list of all the medicines, herbs, non-prescription drugs, or dietary supplements you use. Also tell them if you smoke, drink alcohol, or use illegal drugs. Some items may interact with your medicine. What should I watch for while using this medication? Report any side effects that do not go away with your care team. Call your care team if any unusual symptoms occur within 6 weeks of receiving this vaccine. You may still catch the flu, but the illness is not usually as bad. You cannot get the flu from the vaccine. The vaccine will not protect against colds or other illnesses that may cause fever. The vaccine is needed every year. What side effects may I notice from receiving this medication? Side effects that you should report to your care team as soon as possible: Allergic reactions--skin rash, itching, hives, swelling of the face, lips, tongue, or throat Side effects that usually do not require medical attention (report these to your care team if they continue or are bothersome): Chills Fatigue Headache Joint pain Loss of appetite Muscle pain Nausea Pain, redness, or irritation at injection site This list may not describe all possible side effects. Call your doctor for medical advice about side effects. You may report side effects to FDA at  1-800-FDA-1088. Where should I keep my medication? The vaccine is only given by your care team. It will not be stored at home. NOTE: This sheet is a summary. It may not cover all  possible information. If you have questions about this medicine, talk to your doctor, pharmacist, or health care provider.  2024 Elsevier/Gold Standard (2021-07-20 00:00:00)   HPV Vaccine Information for Parents  Human papillomavirus (HPV) is a common virus. It spreads easily from person to person through skin-to-skin or sexual contact. There are many types of HPV viruses. Genital or mucosal HPV can cause warts in the genitals. Cutaneous or nonmucosal HPV can cause warts on the hands or feet. Some genital HPV types may cause cancer. Your child can get a shot to help prevent the HPV types that can cause cancer, genital warts, or warts near the opening of the butt (anus). The vaccine is safe and effective. It is recommended that your child get the vaccine at about 25-45 years of age. Getting the vaccine before your child is sexually active gives them the best protection from HPV through adulthood. How can HPV affect my child? An infection with HPV can cause: Genital warts. Mouth or throat cancer. Cancer of the anus. Cancer of the lowest part of the uterus (cervix), outer female genital area (vulva), or vagina. Cancer of the penis (penile cancer). During pregnancy, HPV can be passed to the baby. This infection can cause warts to form in the baby's throat and mouth. What actions can I take to lower my child's risk for HPV? Have your child get the HPV vaccine before they become sexually active. The best time to get the shot is at around 46-93 years of age. The vaccine may be given to children as young as 45 years old.  If your child gets the vaccine before they are 38 years old, it can be given as 2 shots, 6-12 months apart. In some cases, 3 doses are needed. Your child may need 3 doses if: They get the first dose before they are 24 years old but do not have a second dose within 6-12 months after the first dose. They get their first dose after they are 24 years old. They will need to get the other 2  doses within 6 months of the first dose. They have a weak body defense system (immune system). What are the risks and benefits of the HPV vaccine? Benefits Getting the vaccine can help prevent certain cancers. These include: Oral cancer. This is cancer of the mouth. Anal cancer. This is cancer of the anus. Cancers of the cervix, vulva, and vagina in females. Penile cancer in males. Your child is less likely to get these cancers if they get the vaccine before they become sexually active. The vaccine also prevents genital warts caused by HPV. Risks In rare cases, side effects and reactions have been reported. These include: Soreness, redness, or swelling at the injection site. Dizziness or fainting. Fever. Headache. Muscle or joint pain. Who should not get the HPV vaccine or wait to get it? Some children should not get the HPV vaccine or should wait to get it. Ask the health care provider if your child should get the vaccine if: Your child has had a severe allergic reaction to other vaccines. Your child is allergic to yeast. Your child has a fever. Your child has had a recent illness. Your child is pregnant or may be pregnant. Where to find more information Centers for Disease Control and Prevention (CDC):  TonerPromos.no American Academy of Pediatrics (AAP): healthychildren.org This information is not intended to replace advice given to you by your health care provider. Make sure you discuss any questions you have with your health care provider. Document Revised: 11/05/2021 Document Reviewed: 11/05/2021 Elsevier Patient Education  2024 [employer].

## 2023-03-29 ENCOUNTER — Other Ambulatory Visit: Payer: Self-pay | Admitting: Nurse Practitioner

## 2023-03-29 DIAGNOSIS — B009 Herpesviral infection, unspecified: Secondary | ICD-10-CM

## 2023-03-30 ENCOUNTER — Other Ambulatory Visit: Payer: Self-pay | Admitting: Nurse Practitioner

## 2023-03-30 DIAGNOSIS — B009 Herpesviral infection, unspecified: Secondary | ICD-10-CM

## 2023-03-30 MED ORDER — VALACYCLOVIR HCL 1 G PO TABS: 1000.0000 mg | ORAL_TABLET | Freq: Every day | ORAL | 1 refills | Status: AC

## 2023-03-30 NOTE — Telephone Encounter (Signed)
 Copied from CRM [id number]. Topic: Clinical - Prescription Issue >> Mar 30, 2023  8:28 AM Myrick T wrote: Reason for CRM: medication for valACYclovir  (VALTREX ) 1000 MG tablet failure to pharmacy on 2/4 and patient stated she really needs her meds. Please f/u with patient as she says this is her 3rd call about this.    Spoke with patient. Patient reports that [facility] said they did not received medication refilled. Advised that refill was sent on 03/28/2023. Call placed to Pharmacy spoke with [facility], [facility] voiced that for some reason they did not receive. Medication prescription reviewed the henry Medication resent to pharmacy.

## 2023-03-30 NOTE — Telephone Encounter (Signed)
 Copied from CRM [id number]. Topic: Clinical - Prescription Issue >> Mar 30, 2023  8:28 AM Myrick T wrote: Reason for CRM: medication for valACYclovir  (VALTREX ) 1000 MG tablet failure to pharmacy on 2/4 and patient stated she really needs her meds. Please f/u with patient as she says this is her 3rd call about this.
